# Patient Record
Sex: Male | Born: 1975 | Race: Black or African American | Hispanic: No | Marital: Married | Smoking: Current every day smoker
Health system: Southern US, Community
[De-identification: ages and names within clinical notes are randomized; demographics above are authoritative.]

---

## 2005-05-10 ENCOUNTER — Inpatient Hospital Stay (HOSPITAL_COMMUNITY): Admission: EM | Admit: 2005-05-10 | Discharge: 2005-05-12 | Payer: Self-pay | Admitting: Psychiatry

## 2005-05-10 ENCOUNTER — Ambulatory Visit: Payer: Self-pay | Admitting: Psychiatry

## 2013-03-05 ENCOUNTER — Other Ambulatory Visit: Payer: Self-pay | Admitting: Occupational Medicine

## 2013-03-05 ENCOUNTER — Ambulatory Visit: Payer: Self-pay

## 2013-03-05 DIAGNOSIS — R52 Pain, unspecified: Secondary | ICD-10-CM

## 2016-10-10 ENCOUNTER — Emergency Department (HOSPITAL_COMMUNITY): Payer: Self-pay

## 2016-10-10 ENCOUNTER — Emergency Department (HOSPITAL_COMMUNITY)
Admission: EM | Admit: 2016-10-10 | Discharge: 2016-10-10 | Disposition: A | Discharge status: Home / Self Care | Payer: Self-pay | Attending: Emergency Medicine | Admitting: Emergency Medicine

## 2016-10-10 ENCOUNTER — Encounter (HOSPITAL_COMMUNITY): Payer: Self-pay | Admitting: Emergency Medicine

## 2016-10-10 DIAGNOSIS — S71101A Unspecified open wound, right thigh, initial encounter: Secondary | ICD-10-CM | POA: Insufficient documentation

## 2016-10-10 DIAGNOSIS — T07XXXA Unspecified multiple injuries, initial encounter: Secondary | ICD-10-CM

## 2016-10-10 DIAGNOSIS — R739 Hyperglycemia, unspecified: Secondary | ICD-10-CM

## 2016-10-10 DIAGNOSIS — Y929 Unspecified place or not applicable: Secondary | ICD-10-CM | POA: Insufficient documentation

## 2016-10-10 DIAGNOSIS — W3400XA Accidental discharge from unspecified firearms or gun, initial encounter: Secondary | ICD-10-CM

## 2016-10-10 DIAGNOSIS — Y9389 Activity, other specified: Secondary | ICD-10-CM | POA: Insufficient documentation

## 2016-10-10 DIAGNOSIS — T1490XA Injury, unspecified, initial encounter: Secondary | ICD-10-CM

## 2016-10-10 DIAGNOSIS — Y999 Unspecified external cause status: Secondary | ICD-10-CM | POA: Insufficient documentation

## 2016-10-10 DIAGNOSIS — R791 Abnormal coagulation profile: Secondary | ICD-10-CM | POA: Insufficient documentation

## 2016-10-10 DIAGNOSIS — S61306A Unspecified open wound of right little finger with damage to nail, initial encounter: Secondary | ICD-10-CM | POA: Insufficient documentation

## 2016-10-10 LAB — PROTIME-INR
INR: 1
PROTHROMBIN TIME: 13.2 s (ref 11.4–15.2)

## 2016-10-10 LAB — COMPREHENSIVE METABOLIC PANEL
ALK PHOS: 86 U/L (ref 38–126)
ALT: 21 U/L (ref 17–63)
AST: 24 U/L (ref 15–41)
Albumin: 4.2 g/dL (ref 3.5–5.0)
Anion gap: 13 (ref 5–15)
BILIRUBIN TOTAL: 0.7 mg/dL (ref 0.3–1.2)
BUN: 16 mg/dL (ref 6–20)
CO2: 23 mmol/L (ref 22–32)
Calcium: 9.5 mg/dL (ref 8.9–10.3)
Chloride: 103 mmol/L (ref 101–111)
Creatinine, Ser: 1.69 mg/dL — ABNORMAL HIGH (ref 0.61–1.24)
GFR calc Af Amer: 56 mL/min — ABNORMAL LOW (ref >60)
GFR calc non Af Amer: 48 mL/min — ABNORMAL LOW (ref >60)
GLUCOSE: 286 mg/dL — AB (ref 65–99)
Potassium: 4 mmol/L (ref 3.5–5.1)
SODIUM: 139 mmol/L (ref 135–145)
TOTAL PROTEIN: 7.3 g/dL (ref 6.5–8.1)

## 2016-10-10 LAB — TYPE AND SCREEN
BLOOD PRODUCT EXPIRATION DATE: 201801262359
Blood Product Expiration Date: 201801262359
ISSUE DATE / TIME: 201712240406
ISSUE DATE / TIME: 201712240406
Unit Type and Rh: 9500
Unit Type and Rh: 9500

## 2016-10-10 LAB — I-STAT CHEM 8, ED
BUN: 18 mg/dL (ref 6–20)
CHLORIDE: 101 mmol/L (ref 101–111)
Calcium, Ion: 1.18 mmol/L (ref 1.15–1.40)
Creatinine, Ser: 1.6 mg/dL — ABNORMAL HIGH (ref 0.61–1.24)
Glucose, Bld: 272 mg/dL — ABNORMAL HIGH (ref 65–99)
HEMATOCRIT: 43 % (ref 39.0–52.0)
Hemoglobin: 14.6 g/dL (ref 13.0–17.0)
POTASSIUM: 4.1 mmol/L (ref 3.5–5.1)
Sodium: 142 mmol/L (ref 135–145)
TCO2: 26 mmol/L (ref 0–100)

## 2016-10-10 LAB — URINALYSIS, ROUTINE W REFLEX MICROSCOPIC
BILIRUBIN URINE: NEGATIVE
Bacteria, UA: NONE SEEN
Glucose, UA: >=500 mg/dL — AB
Hgb urine dipstick: NEGATIVE
KETONES UR: 5 mg/dL — AB
Nitrite: NEGATIVE
PH: 6 (ref 5.0–8.0)
Protein, ur: 100 mg/dL — AB
Specific Gravity, Urine: 1.024 (ref 1.005–1.030)

## 2016-10-10 LAB — I-STAT CG4 LACTIC ACID, ED
LACTIC ACID, VENOUS: 4.64 mmol/L — AB (ref 0.5–1.9)
Lactic Acid, Venous: 2.22 mmol/L (ref 0.5–1.9)

## 2016-10-10 LAB — PREPARE FRESH FROZEN PLASMA
Unit division: 0
Unit division: 0

## 2016-10-10 LAB — CBC
HEMATOCRIT: 40.7 % (ref 39.0–52.0)
Hemoglobin: 13.8 g/dL (ref 13.0–17.0)
MCH: 28.7 pg (ref 26.0–34.0)
MCHC: 33.9 g/dL (ref 30.0–36.0)
MCV: 84.6 fL (ref 78.0–100.0)
Platelets: 366 10*3/uL (ref 150–400)
RBC: 4.81 MIL/uL (ref 4.22–5.81)
RDW: 13.6 % (ref 11.5–15.5)
WBC: 10 10*3/uL (ref 4.0–10.5)

## 2016-10-10 LAB — CDS SEROLOGY

## 2016-10-10 LAB — ABO/RH: ABO/RH(D): A POS

## 2016-10-10 LAB — ETHANOL

## 2016-10-10 LAB — I-STAT TROPONIN, ED: Troponin i, poc: 0 ng/mL (ref 0.00–0.08)

## 2016-10-10 LAB — CBG MONITORING, ED: Glucose-Capillary: 142 mg/dL — ABNORMAL HIGH (ref 65–99)

## 2016-10-10 MED ORDER — TETANUS-DIPHTH-ACELL PERTUSSIS 5-2.5-18.5 LF-MCG/0.5 IM SUSP
0.5000 mL | Freq: Once | INTRAMUSCULAR | Status: AC
  Administered 2016-10-10: 0.5 mL via INTRAMUSCULAR
  Filled 2016-10-10: qty 0.5

## 2016-10-10 MED ORDER — HYDROCODONE-ACETAMINOPHEN 5-325 MG PO TABS: 1.0000 | ORAL_TABLET | ORAL | 0 refills | Status: AC | PRN

## 2016-10-10 MED ORDER — OXYCODONE-ACETAMINOPHEN 5-325 MG PO TABS
2.0000 | ORAL_TABLET | Freq: Once | ORAL | Status: AC
  Administered 2016-10-10: 2 via ORAL
  Filled 2016-10-10: qty 2

## 2016-10-10 MED ORDER — AMOXICILLIN-POT CLAVULANATE 875-125 MG PO TABS: 1.0000 | ORAL_TABLET | Freq: Two times a day (BID) | ORAL | 0 refills | Status: AC

## 2016-10-10 MED ORDER — LIDOCAINE HCL (PF) 1 % IJ SOLN
30.0000 mL | Freq: Once | INTRAMUSCULAR | Status: AC
  Administered 2016-10-10: 30 mL
  Filled 2016-10-10: qty 30

## 2016-10-10 MED ORDER — IOPAMIDOL (ISOVUE-370) INJECTION 76%
INTRAVENOUS | Status: AC
  Administered 2016-10-10: 100 mL
  Filled 2016-10-10: qty 100

## 2016-10-10 MED ORDER — SODIUM CHLORIDE 0.9 % IV BOLUS (SEPSIS)
1000.0000 mL | Freq: Once | INTRAVENOUS | Status: AC
  Administered 2016-10-10: 1000 mL via INTRAVENOUS
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
## 2016-10-10 NOTE — Progress Notes (Signed)
Orthopedic Tech Progress Note Patient Details:  Sean Livingston 10/19/1975 [id number]  Ortho Devices Type of Ortho Device: Crutches, Finger splint, Knee Immobilizer Ortho Device/Splint Location: rue/rle Ortho Device/Splint Interventions: Application   Jaana Brodt 10/10/2016, 10:36 AM

## 2016-10-10 NOTE — ED Notes (Signed)
Ortho tech paged  
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
## 2016-10-10 NOTE — ED Notes (Signed)
Otho tech at bedside.  
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
## 2016-10-10 NOTE — ED Notes (Signed)
Sheri  PA at bedside 

## 2016-10-10 NOTE — ED Notes (Signed)
Ortho tech returned page, will apply immobilizer, splint, and crutches for pt.
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
## 2016-10-10 NOTE — ED Notes (Signed)
Patient transported to CT 
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
## 2016-10-12 ENCOUNTER — Telehealth: Payer: Self-pay | Admitting: *Deleted

## 2016-10-15 ENCOUNTER — Ambulatory Visit (INDEPENDENT_AMBULATORY_CARE_PROVIDER_SITE_OTHER): Payer: Self-pay | Admitting: Surgery

## 2016-10-15 ENCOUNTER — Encounter (INDEPENDENT_AMBULATORY_CARE_PROVIDER_SITE_OTHER): Payer: Self-pay | Admitting: Surgery

## 2016-10-15 ENCOUNTER — Telehealth: Payer: Self-pay | Admitting: *Deleted

## 2016-10-15 VITALS — BP 148/95 | HR 67 | Ht 69.0 in | Wt 200.0 lb

## 2016-10-15 DIAGNOSIS — W3400XA Accidental discharge from unspecified firearms or gun, initial encounter: Secondary | ICD-10-CM

## 2016-10-15 MED ORDER — OXYCODONE-ACETAMINOPHEN 5-325 MG PO TABS: 1.0000 | ORAL_TABLET | Freq: Four times a day (QID) | ORAL | 0 refills | Status: AC | PRN

## 2016-10-15 NOTE — Patient Instructions (Signed)
Continue to weight-bear as tolerated with crutches. Must finish out antibiotic. Elevate right foot above heart level to decrease any swelling.
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
## 2016-10-22 ENCOUNTER — Ambulatory Visit (INDEPENDENT_AMBULATORY_CARE_PROVIDER_SITE_OTHER): Payer: Self-pay | Admitting: Orthopaedic Surgery

## 2016-11-16 ENCOUNTER — Ambulatory Visit (INDEPENDENT_AMBULATORY_CARE_PROVIDER_SITE_OTHER): Payer: Self-pay | Admitting: Orthopaedic Surgery

## 2016-11-16 ENCOUNTER — Encounter (INDEPENDENT_AMBULATORY_CARE_PROVIDER_SITE_OTHER): Payer: Self-pay | Admitting: Orthopaedic Surgery

## 2016-11-16 ENCOUNTER — Ambulatory Visit (INDEPENDENT_AMBULATORY_CARE_PROVIDER_SITE_OTHER): Payer: Self-pay

## 2016-11-16 VITALS — Ht 69.0 in | Wt 200.0 lb

## 2016-11-16 DIAGNOSIS — M79604 Pain in right leg: Secondary | ICD-10-CM
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
